# Patient Record
Sex: Male | Born: 1980 | Race: White | Hispanic: No | Marital: Single | Smoking: Current every day smoker
Health system: Southern US, Community
[De-identification: ages and names within clinical notes are randomized; demographics above are authoritative.]

## PROBLEM LIST (undated history)

## (undated) HISTORY — PX: HAND SURGERY: SHX662

## (undated) HISTORY — PX: HERNIA REPAIR: SHX51

---

## 2005-05-31 ENCOUNTER — Emergency Department: Payer: Self-pay | Admitting: Emergency Medicine

## 2005-07-12 ENCOUNTER — Emergency Department: Payer: Self-pay | Admitting: Emergency Medicine

## 2008-10-20 ENCOUNTER — Emergency Department: Payer: Self-pay | Admitting: Unknown Physician Specialty

## 2009-11-18 ENCOUNTER — Emergency Department: Payer: Self-pay | Admitting: Emergency Medicine

## 2015-10-20 ENCOUNTER — Emergency Department
Admission: EM | Admit: 2015-10-20 | Discharge: 2015-10-20 | Disposition: A | Discharge status: Home / Self Care | Payer: Self-pay | Attending: Emergency Medicine | Admitting: Emergency Medicine

## 2015-10-20 ENCOUNTER — Encounter: Payer: Self-pay | Admitting: Emergency Medicine

## 2015-10-20 DIAGNOSIS — F17210 Nicotine dependence, cigarettes, uncomplicated: Secondary | ICD-10-CM | POA: Insufficient documentation

## 2015-10-20 DIAGNOSIS — K047 Periapical abscess without sinus: Secondary | ICD-10-CM | POA: Insufficient documentation

## 2015-10-20 DIAGNOSIS — J0100 Acute maxillary sinusitis, unspecified: Secondary | ICD-10-CM | POA: Insufficient documentation

## 2015-10-20 MED ORDER — PREDNISONE 20 MG PO TABS: ORAL_TABLET | ORAL | Status: DC

## 2015-10-20 MED ORDER — AMOXICILLIN-POT CLAVULANATE 875-125 MG PO TABS: 1.0000 | ORAL_TABLET | Freq: Two times a day (BID) | ORAL | Status: DC

## 2015-10-20 NOTE — ED Notes (Signed)
C/o left side of face hurting intermittently for 1 week, left lower jaw tooth ache onset today.

## 2015-10-20 NOTE — ED Provider Notes (Signed)
[facility] Emergency Department Provider Note  ____________________________________________  Time seen: Approximately 9:46 PM  I have reviewed the triage vital signs and the nursing notes.   HISTORY  Chief Complaint Dental Pain and Facial Pain    HPI Aaron Bates. is a 35 y.o. male , NAD, presents to the emergency department of 1 week history of sinus pressure and congestion with onset of left lower tooth pain today. Had sinus symptoms for about a week and which have not been improving with over-the-counter medications. Noted some left maxillary tenderness over the last few days with onset of left lower tooth pain and swelling over the last 24 hours. Has not had any fevers, chills, body aches.Notes he has gone to [facility] in [facility] [place] for consult to repair his teeth but his dental insurance does not begin until 10/29/2015.   History reviewed. No pertinent past medical history.  There are no active problems to display for this patient.   History reviewed. No pertinent past surgical history.  Current Outpatient Rx  Name  Route  Sig  Dispense  Refill  . amoxicillin-clavulanate (AUGMENTIN) 875-125 MG tablet   Oral   Take 1 tablet by mouth 2 (two) times daily.   20 tablet   0   . predniSONE (DELTASONE) 20 MG tablet      Take 2 tablets by mouth, once daily, for 5 days   10 tablet   0     Allergies Review of patient's allergies indicates no known allergies.  No family history on file.  Social History Social History  Substance Use Topics  . Smoking status: Current Every Day Smoker -- 1.00 packs/day    Types: Cigarettes  . Smokeless tobacco: Never Used  . Alcohol Use: Yes     Comment: weekly     Review of Systems  Constitutional: No fever/chills Eyes:  No discharge ENT: Positive sinus pressure/congestion, nasal congestion, sore throat, left lower tooth pain/gum swelling. Cardiovascular: No chest  pain. Respiratory: Positive cough. No shortness of breath. No wheezing.  Gastrointestinal: No abdominal pain.  No nausea, vomiting.   Musculoskeletal: Negative for back, neck pain.  Skin: Negative for rash. Neurological: Negative for headaches, focal weakness or numbness. 10-point ROS otherwise negative.  ____________________________________________   PHYSICAL EXAM:  VITAL SIGNS: ED Triage Vitals  Enc Vitals Group     BP 10/20/15 2117 157/103 mmHg     Pulse Rate 10/20/15 2117 99     Resp 10/20/15 2117 16     Temp 10/20/15 2117 98.3 F (36.8 C)     Temp Source 10/20/15 2117 Oral     SpO2 10/20/15 2117 98 %     Weight 10/20/15 2117 181 lb (82.101 kg)     Height 10/20/15 2117  (1.803 m)     Head Cir --      Peak Flow --      Pain Score 10/20/15 2118 7     Pain Loc --      Pain Edu? --      Excl. in GC? --     Constitutional: Alert and oriented. Well appearing and in no acute distress. Eyes: Conjunctivae are normal. PERRL. EOMI without pain.  Head: Atraumatic. ENT:  Left maxillary tenderness to palpation.       Ears: TMs visualized within normal limits.      Nose: No congestion/rhinnorhea.      Mouth/Throat: Multiple broken teeth. Left lower gumline with mild erythema and swelling.  Mucous membranes are moist.Pharynx without erythema, swelling or exudate.   Neck: No stridor.Supple with FROM.  Hematological/Lymphatic/Immunilogical: No cervical lymphadenopathy. Cardiovascular: Normal rate, regular rhythm. Normal S1 and S2.   Respiratory: Normal respiratory effort without tachypnea or retractions. Lungs CTAB. Musculoskeletal: Pain to palpation along the left lower jaw line, proximally. No TMJ tenderness or locking.  Neurologic:  Normal speech and language. No gross focal neurologic deficits are appreciated.  Skin:  Left maxillary region with trace swelling. Skin is warm, dry and intact. No rash noted. Psychiatric: Mood and affect are normal. Speech and behavior are  normal. Patient exhibits appropriate insight and judgement.   ____________________________________________   LABS  None ____________________________________________  EKG  None ____________________________________________  RADIOLOGY  None ____________________________________________    PROCEDURES  Procedure(s) performed: None   Medications - No data to display   ____________________________________________   INITIAL IMPRESSION / ASSESSMENT AND PLAN / ED COURSE  Patient's diagnosis is consistent with acute sinusitis and abscessed tooth. Patient will be discharged home with prescriptions for Augmentin to take twice daily for 10 days as well as prednisone to take as prescribed to decrease inflammation and pain. Patient is to follow up with [facility] in March to continue dental care. Patient is given ED precautions to return to the ED for any worsening or new symptoms.   ____________________________________________  FINAL CLINICAL IMPRESSION(S) / ED DIAGNOSES  Final diagnoses:  Acute maxillary sinusitis, recurrence not specified  Tooth abscess      NEW MEDICATIONS STARTED DURING THIS VISIT:  New Prescriptions   AMOXICILLIN-CLAVULANATE (AUGMENTIN) 875-125 MG TABLET    Take 1 tablet by mouth 2 (two) times daily.   PREDNISONE (DELTASONE) 20 MG TABLET    Take 2 tablets by mouth, once daily, for 5 days         Hope Pigeon, PA-C 10/20/15 2155  Sharyn Creamer, MD 10/21/15 0000

## 2015-10-20 NOTE — ED Notes (Signed)
AAOx3.  Skin warm and dry.  NAD 

## 2015-10-20 NOTE — Discharge Instructions (Signed)
Dental Abscess A dental abscess is pus in or around a tooth. HOME CARE  Take medicines only as told by your dentist.  If you were prescribed antibiotic medicine, finish all of it even if you start to feel better.  Rinse your mouth (gargle) often with salt water.  Do not drive or use heavy machinery, like a lawn mower, while taking pain medicine.  Do not apply heat to the outside of your mouth.  Keep all follow-up visits as told by your dentist. This is important. GET HELP IF:  Your pain is worse, and medicine does not help. GET HELP RIGHT AWAY IF:  You have a fever or chills.  Your symptoms suddenly get worse.  You have a very bad headache.  You have problems breathing or swallowing.  You have trouble opening your mouth.  You have puffiness (swelling) in your neck or around your eye.   This information is not intended to replace advice given to you by your health care provider. Make sure you discuss any questions you have with your health care provider.   Document Released: 12/31/2014 Document Reviewed: 12/31/2014 Elsevier Interactive Patient Education 2016 [employer].  Sinusitis, Adult Sinusitis is redness, soreness, and puffiness (inflammation) of the air pockets in the bones of your face (sinuses). The redness, soreness, and puffiness can cause air and mucus to get trapped in your sinuses. This can allow germs to grow and cause an infection.  HOME CARE   Drink enough fluids to keep your pee (urine) clear or pale yellow.  Use a humidifier in your home.  Run a hot shower to create steam in the bathroom. Sit in the bathroom with the door closed. Breathe in the steam 3-4 times a day.  Put a warm, moist washcloth on your face 3-4 times a day, or as told by your doctor.  Use salt water sprays (saline sprays) to wet the thick fluid in your nose. This can help the sinuses drain.  Only take medicine as told by your doctor. GET HELP RIGHT AWAY IF:   Your pain gets  worse.  You have very bad headaches.  You are sick to your stomach (nauseous).  You throw up (vomit).  You are very sleepy (drowsy) all the time.  Your face is puffy (swollen).  Your vision changes.  You have a stiff neck.  You have trouble breathing. MAKE SURE YOU:   Understand these instructions.  Will watch your condition.  Will get help right away if you are not doing well or get worse.   This information is not intended to replace advice given to you by your health care provider. Make sure you discuss any questions you have with your health care provider.   Document Released: 02/02/2008 Document Revised: 09/06/2014 Document Reviewed: 03/21/2012 Elsevier Interactive Patient Education [employer]

## 2016-06-26 ENCOUNTER — Emergency Department: Payer: BLUE CROSS/BLUE SHIELD

## 2016-06-26 ENCOUNTER — Emergency Department
Admission: EM | Admit: 2016-06-26 | Discharge: 2016-06-26 | Disposition: A | Discharge status: Home / Self Care | Payer: BLUE CROSS/BLUE SHIELD | Attending: Emergency Medicine | Admitting: Emergency Medicine

## 2016-06-26 DIAGNOSIS — W2201XA Walked into wall, initial encounter: Secondary | ICD-10-CM | POA: Insufficient documentation

## 2016-06-26 DIAGNOSIS — S6991XA Unspecified injury of right wrist, hand and finger(s), initial encounter: Secondary | ICD-10-CM | POA: Diagnosis present

## 2016-06-26 DIAGNOSIS — S62304A Unspecified fracture of fourth metacarpal bone, right hand, initial encounter for closed fracture: Secondary | ICD-10-CM | POA: Insufficient documentation

## 2016-06-26 DIAGNOSIS — F17210 Nicotine dependence, cigarettes, uncomplicated: Secondary | ICD-10-CM | POA: Insufficient documentation

## 2016-06-26 DIAGNOSIS — Y929 Unspecified place or not applicable: Secondary | ICD-10-CM | POA: Diagnosis not present

## 2016-06-26 DIAGNOSIS — Z7952 Long term (current) use of systemic steroids: Secondary | ICD-10-CM | POA: Insufficient documentation

## 2016-06-26 DIAGNOSIS — Z792 Long term (current) use of antibiotics: Secondary | ICD-10-CM | POA: Diagnosis not present

## 2016-06-26 DIAGNOSIS — Y9389 Activity, other specified: Secondary | ICD-10-CM | POA: Diagnosis not present

## 2016-06-26 DIAGNOSIS — Y999 Unspecified external cause status: Secondary | ICD-10-CM | POA: Insufficient documentation

## 2016-06-26 DIAGNOSIS — S62302A Unspecified fracture of third metacarpal bone, right hand, initial encounter for closed fracture: Secondary | ICD-10-CM | POA: Insufficient documentation

## 2016-06-26 DIAGNOSIS — S6291XA Unspecified fracture of right wrist and hand, initial encounter for closed fracture: Secondary | ICD-10-CM

## 2016-06-26 MED ORDER — NAPROXEN 500 MG PO TABS
500.0000 mg | ORAL_TABLET | Freq: Once | ORAL | Status: AC
  Administered 2016-06-26: 500 mg via ORAL
  Filled 2016-06-26: qty 1

## 2016-06-26 MED ORDER — TRAMADOL HCL 50 MG PO TABS: 50.0000 mg | ORAL_TABLET | Freq: Four times a day (QID) | ORAL | 0 refills | Status: AC | PRN

## 2016-06-26 MED ORDER — NAPROXEN 500 MG PO TABS: 500.0000 mg | ORAL_TABLET | Freq: Two times a day (BID) | ORAL | 0 refills | Status: DC

## 2016-06-26 NOTE — ED Notes (Signed)
See triage note  Punched wall with right hand   Pain and swelling noted

## 2016-06-26 NOTE — ED Triage Notes (Signed)
Pt states he had a crazy night last night and punched a wall. Pain and swelling in right hand.

## 2016-06-26 NOTE — ED Provider Notes (Signed)
[facility] Emergency Department Provider Note   ____________________________________________   First MD Initiated Contact with Patient 06/26/16 [phone]     (approximate)  I have reviewed the triage vital signs and the nursing notes.   HISTORY  Chief Complaint Hand Injury    HPI Aaron BignessJerry W Deharo Jr. is a 35 y.o. male patient complain of pain and edema to right hand secondary to punching a wall last night.Patient denies loss of sensation. Patient state pain and mild discomfort increased with flexion of the fingers. Patient is right-hand dominant. No palliative measures taken for this complaint.   No past medical history on file.  There are no active problems to display for this patient.   No past surgical history on file.  Prior to Admission medications   Medication Sig Start Date End Date Taking? Authorizing Provider  amoxicillin-clavulanate (AUGMENTIN) 875-125 MG tablet Take 1 tablet by mouth 2 (two) times daily. 10/20/15   Jami L Hagler, PA-C  naproxen (NAPROSYN) 500 MG tablet Take 1 tablet (500 mg total) by mouth 2 (two) times daily with a meal. 06/26/16   Joni Reiningonald K Virgle Arth, PA-C  predniSONE (DELTASONE) 20 MG tablet Take 2 tablets by mouth, once daily, for 5 days 10/20/15   Jami L Hagler, PA-C  traMADol (ULTRAM) 50 MG tablet Take 1 tablet (50 mg total) by mouth every 6 (six) hours as needed. 06/26/16 06/26/17  Joni Reiningonald K Cyris Maalouf, PA-C    Allergies Review of patient's allergies indicates no known allergies.  No family history on file.  Social History Social History  Substance Use Topics  . Smoking status: Current Every Day Smoker    Packs/day: 1.00    Types: Cigarettes  . Smokeless tobacco: Never Used  . Alcohol use Yes     Comment: weekly    Review of Systems Constitutional: No fever/chills Eyes: No visual changes. ENT: No sore throat. Cardiovascular: Denies chest pain. Respiratory: Denies shortness of breath. Gastrointestinal: No abdominal  pain.  No nausea, no vomiting.  No diarrhea.  No constipation. Genitourinary: Negative for dysuria. Musculoskeletal: Negative for back pain. Skin: Negative for rash. Neurological: Negative for headaches, focal weakness or numbness.    ____________________________________________   PHYSICAL EXAM:  VITAL SIGNS: ED Triage Vitals [06/26/16 0905]  Enc Vitals Group     BP (!) 153/99     Pulse Rate 99     Resp 16     Temp 97.8 F (36.6 C)     Temp Source Oral     SpO2 98 %     Weight 180 lb (81.6 kg)     Height 5\' 11"  (1.803 m)     Head Circumference      Peak Flow      Pain Score 5     Pain Loc      Pain Edu?      Excl. in GC?     Constitutional: Alert and oriented. Well appearing and in no acute distress. Eyes: Conjunctivae are normal. PERRL. EOMI. Head: Atraumatic. Nose: No congestion/rhinnorhea. Mouth/Throat: Mucous membranes are moist.  Oropharynx non-erythematous. Neck: No stridor.  No cervical spine tenderness to palpation. Hematological/Lymphatic/Immunilogical: No cervical lymphadenopathy. Cardiovascular: Normal rate, regular rhythm. Grossly normal heart sounds.  Good peripheral circulation. Respiratory: Normal respiratory effort.  No retractions. Lungs CTAB. Gastrointestinal: Soft and nontender. No distention. No abdominal bruits. No CVA tenderness. Musculoskeletal: No obvious deformity to the right hand. Patient has some moderate edema dose aspect of the right hand.  Neurologic:  Normal speech  and language. No gross focal neurologic deficits are appreciated. No gait instability. Skin:  Skin is warm, dry and intact. No rash noted. No abrasions. Psychiatric: Mood and affect are normal. Speech and behavior are normal.  ____________________________________________   LABS (all labs ordered are listed, but only abnormal results are displayed)  Labs Reviewed - No data to  display ____________________________________________  EKG   ____________________________________________  RADIOLOGY  X-rays reveal fracture of the third and fourth metacarpal. ____________________________________________   PROCEDURES  Procedure(s) performed: None  Procedures  Critical Care performed: No  ____________________________________________   INITIAL IMPRESSION / ASSESSMENT AND PLAN / ED COURSE  Pertinent labs & imaging results that were available during my care of the patient were reviewed by me and considered in my medical decision making (see chart for details).  Fracture right hand. Patient given discharge care instructions. Patient advised follow orthopedics by calling for appointment in 2 days. Patient given a prescription for tramadol and naproxen.  Clinical Course   Volar hand splint applied.  ____________________________________________   FINAL CLINICAL IMPRESSION(S) / ED DIAGNOSES  Final diagnoses:  Hand fracture, right, closed, initial encounter      NEW MEDICATIONS STARTED DURING THIS VISIT:  New Prescriptions   NAPROXEN (NAPROSYN) 500 MG TABLET    Take 1 tablet (500 mg total) by mouth 2 (two) times daily with a meal.   TRAMADOL (ULTRAM) 50 MG TABLET    Take 1 tablet (50 mg total) by mouth every 6 (six) hours as needed.     Note:  This document was prepared using Dragon voice recognition software and may include unintentional dictation errors.    Joni ReiningRonald K Kaesen Rodriguez, PA-C 06/26/16 1003    Nita Sicklearolina Veronese, MD 06/26/16 [phone]

## 2018-06-03 IMAGING — DX DG HAND COMPLETE 3+V*R*
3 series · 3 of 3 positions shown · non-contrast
Comparison: None.

CLINICAL DATA: Right hand pain and swelling after striking a wall
last night.

EXAM:
RIGHT HAND - COMPLETE 3+ VIEW

[hand ap]
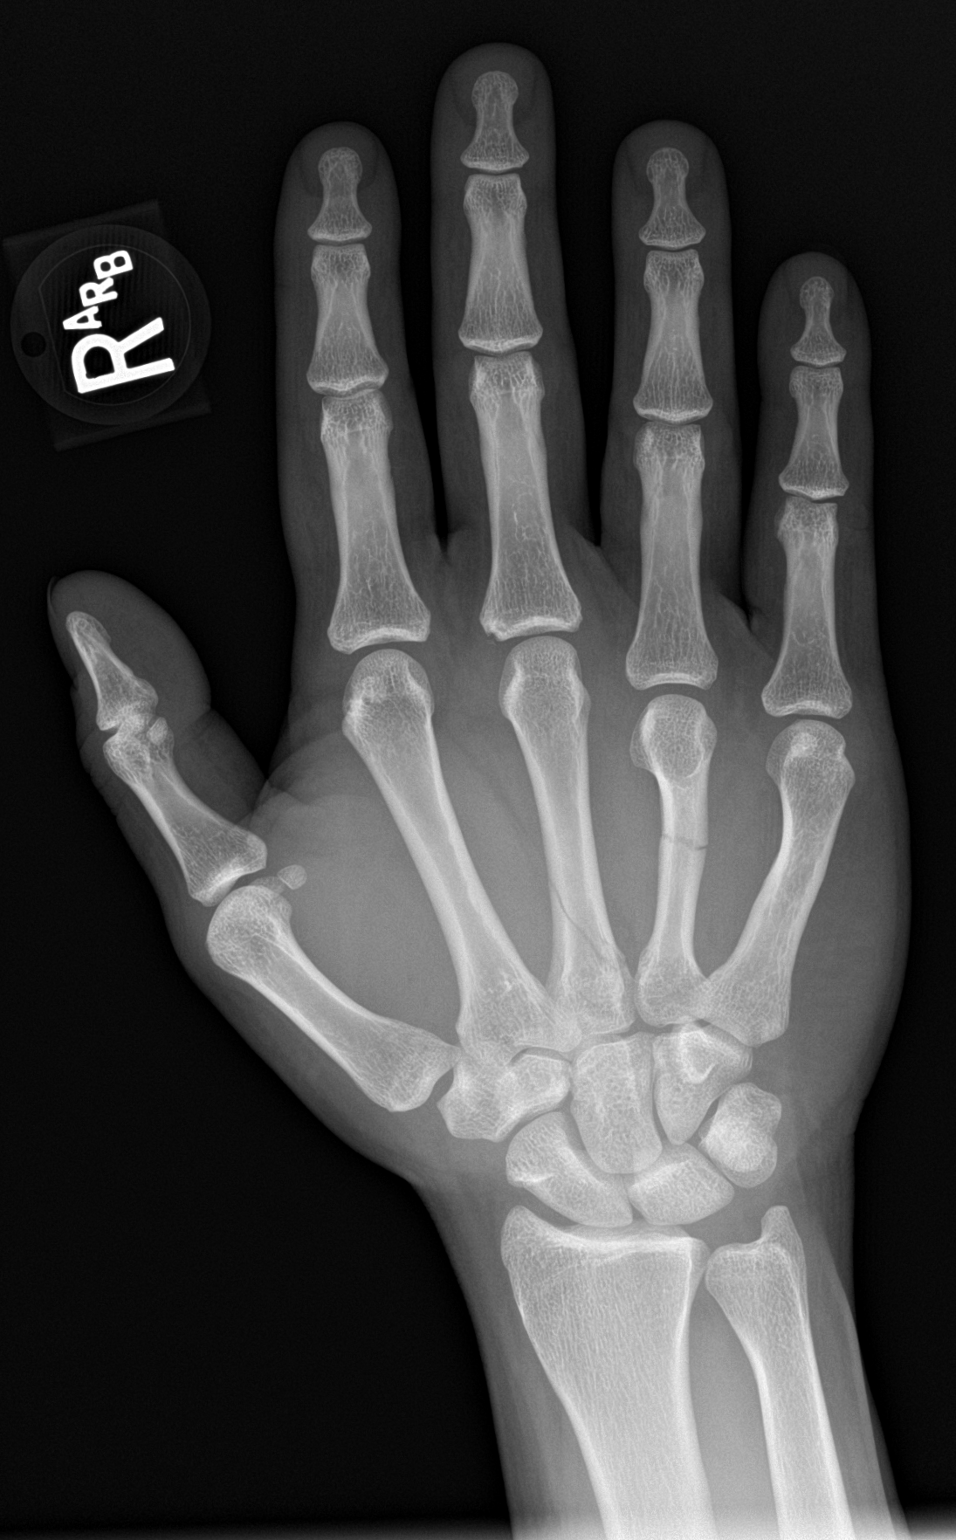

[hand obl]
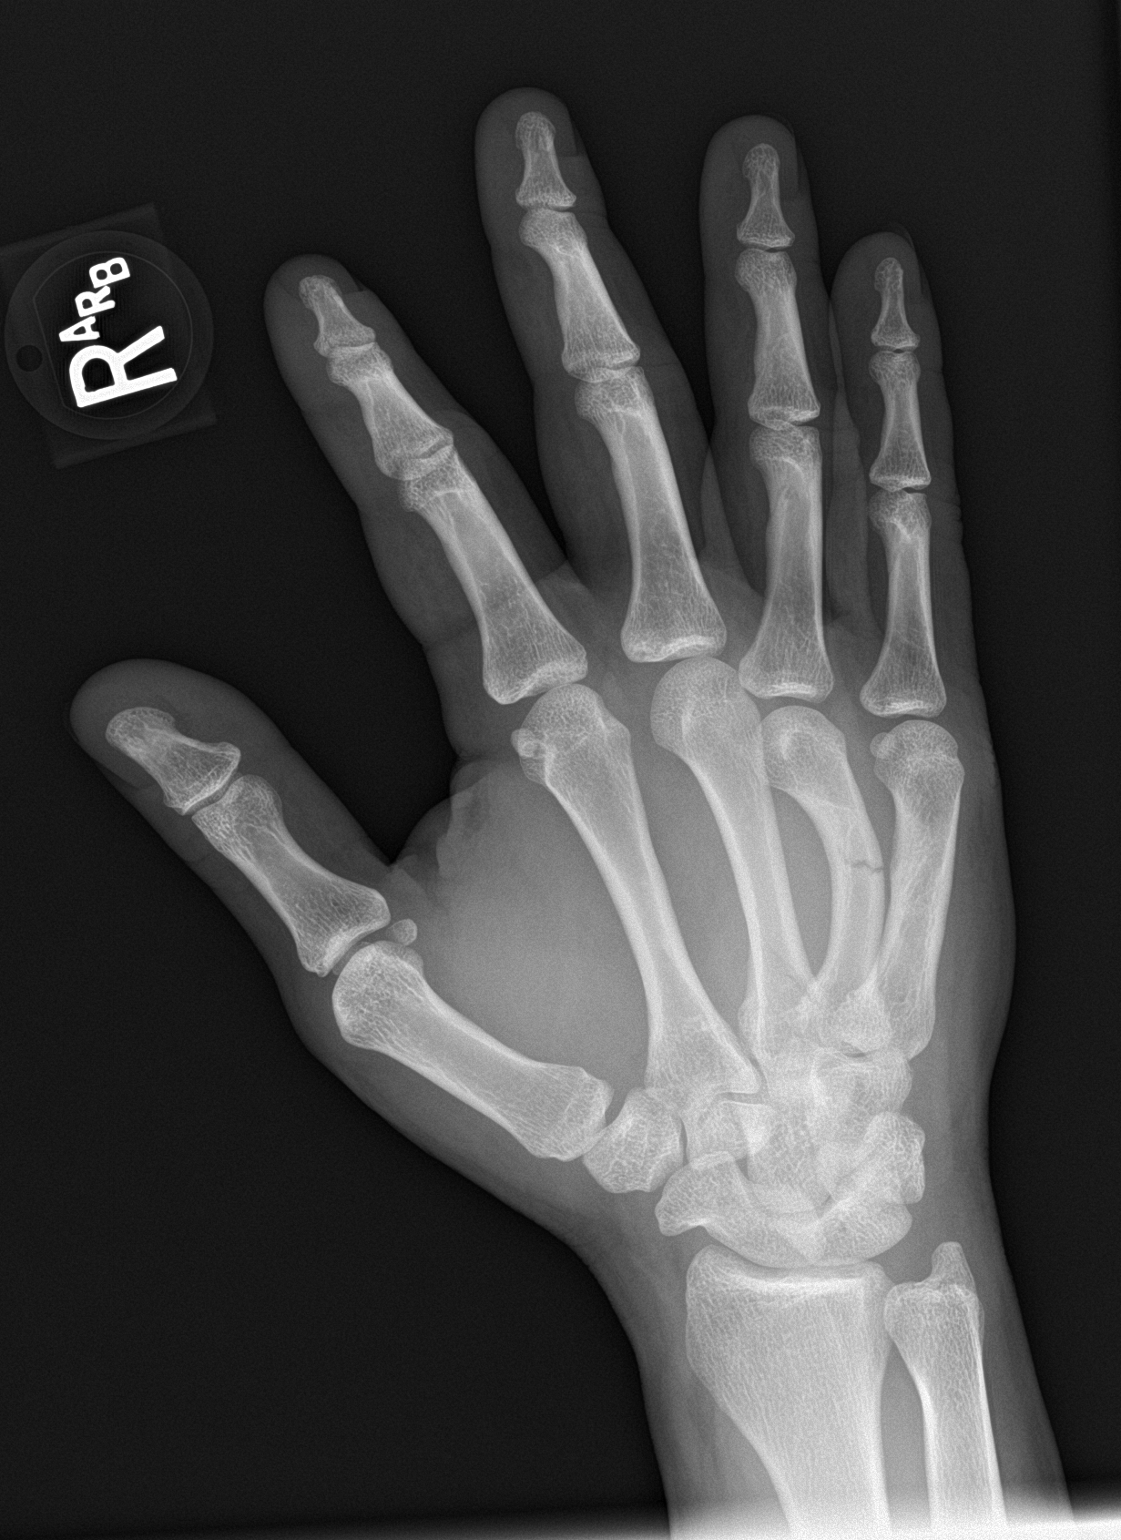

[hand lat]
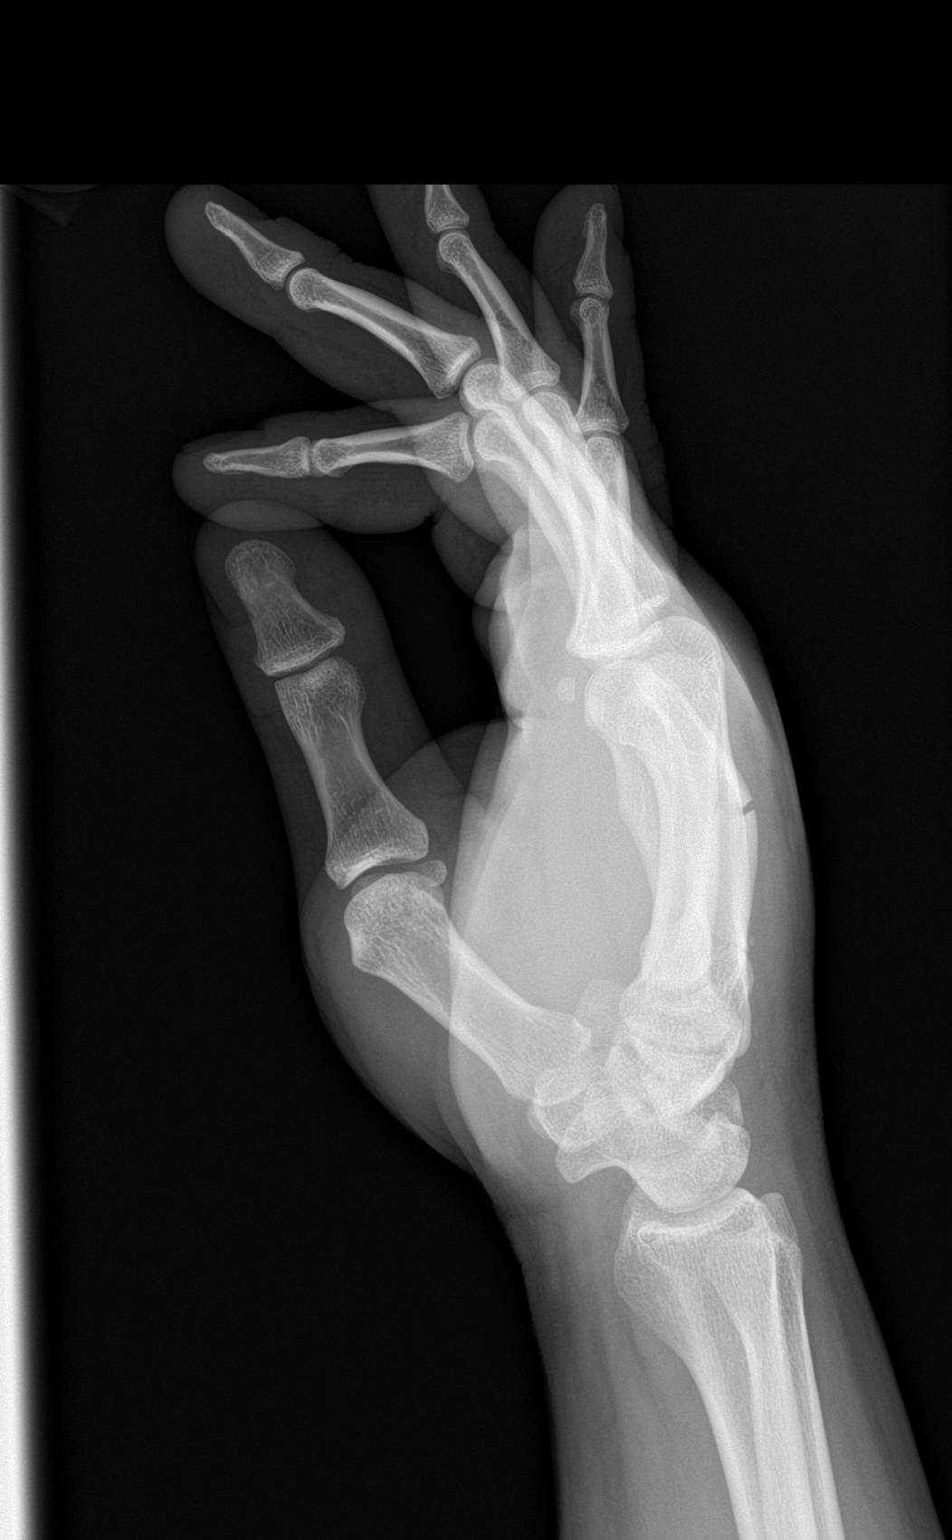

[3 of 3 positions shown; findings below may reference images not displayed]

FINDINGS: There is a transverse fracture of the midshaft of the fourth
metacarpal with minimal angulation. There is a spiral fracture of
the proximal shaft of the third metacarpal. Old deformity of the
fifth metacarpal.
IMPRESSION: Acute fractures of the third and fourth metacarpals.

## 2020-08-07 ENCOUNTER — Ambulatory Visit
Admission: EM | Admit: 2020-08-07 | Discharge: 2020-08-07 | Disposition: A | Discharge status: Home / Self Care | Payer: BLUE CROSS/BLUE SHIELD | Attending: Family Medicine | Admitting: Family Medicine

## 2020-08-07 ENCOUNTER — Other Ambulatory Visit: Payer: Self-pay

## 2020-08-07 DIAGNOSIS — H66003 Acute suppurative otitis media without spontaneous rupture of ear drum, bilateral: Secondary | ICD-10-CM | POA: Diagnosis not present

## 2020-08-07 MED ORDER — KETOROLAC TROMETHAMINE 10 MG PO TABS: 10.0000 mg | ORAL_TABLET | Freq: Four times a day (QID) | ORAL | 0 refills | Status: AC | PRN

## 2020-08-07 MED ORDER — AMOXICILLIN-POT CLAVULANATE 875-125 MG PO TABS: 1.0000 | ORAL_TABLET | Freq: Two times a day (BID) | ORAL | 0 refills | Status: AC

## 2020-08-07 NOTE — ED Triage Notes (Signed)
Patient states that he is here for sinus pain and pressure, ear pressure x 8 days. States that ear pressure just started within the last day.

## 2020-08-07 NOTE — ED Provider Notes (Signed)
[facility]    CSN: [id number] Arrival date & time: 08/07/20  0809  History   Chief Complaint Chief Complaint  Patient presents with  . Facial Pain   HPI  39 year old male presents for evaluation of sinus pain and pressure as well as ear pain.  He reports sinus pain and pressure started approximately 8 days ago.  This has improved.  However, over the past few days he has been experiencing bilateral ear pain.  His daughter has been sick as well.  No fever.  No relieving factors.  He has some associated difficulty hearing.  Pain 5/10 in severity.  Described as aching.  No other complaints.  Home Medications    Prior to Admission medications   Medication Sig Start Date End Date Taking? Authorizing Provider  amoxicillin-clavulanate (AUGMENTIN) 875-125 MG tablet Take 1 tablet by mouth 2 (two) times daily. 08/07/20   Tommie Sams, DO  ketorolac (TORADOL) 10 MG tablet Take 1 tablet (10 mg total) by mouth every 6 (six) hours as needed for moderate pain or severe pain. 08/07/20   Tommie Sams, DO    Family History Family History  Problem Relation Age of Onset  . Healthy Mother   . Cancer Father     Social History Social History   Tobacco Use  . Smoking status: Current Every Day Smoker    Packs/day: 1.00    Types: Cigarettes  . Smokeless tobacco: Never Used  Vaping Use  . Vaping Use: Never used  Substance Use Topics  . Alcohol use: Yes    Comment: weekly  . Drug use: Yes    Types: Marijuana     Allergies   Patient has no known allergies.   Review of Systems Review of Systems  Constitutional: Negative for fever.  HENT: Positive for congestion, ear pain, sinus pressure and sinus pain.    Physical Exam Triage Vital Signs ED Triage Vitals  Enc Vitals Group     BP 08/07/20 0830 (!) 142/108     Pulse Rate 08/07/20 0830 92     Resp 08/07/20 0830 18     Temp 08/07/20 0830 98.3 F (36.8 C)     Temp Source 08/07/20 0830 Oral     SpO2 08/07/20 0830 98 %      Weight 08/07/20 0827 200 lb (90.7 kg)     Height 08/07/20 0827 5\' 11"  (1.803 m)     Head Circumference --      Peak Flow --      Pain Score 08/07/20 0827 5     Pain Loc --      Pain Edu? --      Excl. in GC? --    Updated Vital Signs BP (!) 142/108 (BP Location: Right Arm)   Pulse 92   Temp 98.3 F (36.8 C) (Oral)   Resp 18   Ht 5\' 11"  (1.803 m)   Wt 90.7 kg   SpO2 98%   BMI 27.89 kg/m   Visual Acuity Right Eye Distance:   Left Eye Distance:   Bilateral Distance:    Right Eye Near:   Left Eye Near:    Bilateral Near:     Physical Exam Vitals and nursing note reviewed.  Constitutional:      General: He is not in acute distress.    Appearance: Normal appearance. He is not ill-appearing.  HENT:     Head: Normocephalic and atraumatic.     Ears:     Comments: Right and  left TMs with bulging and erythema bilaterally. Pulmonary:     Effort: Pulmonary effort is normal. No respiratory distress.  Neurological:     Mental Status: He is alert.  Psychiatric:        Mood and Affect: Mood normal.        Behavior: Behavior normal.    UC Treatments / Results  Labs (all labs ordered are listed, but only abnormal results are displayed) Labs Reviewed - No data to display  EKG   Radiology No results found.  Procedures Procedures (including critical care time)  Medications Ordered in UC Medications - No data to display  Initial Impression / Assessment and Plan / UC Course  I have reviewed the triage vital signs and the nursing notes.  Pertinent labs & imaging results that were available during my care of the patient were reviewed by me and considered in my medical decision making (see chart for details).    39 year old male presents with bilateral otitis media.  Treating with Augmentin.  Toradol as needed for pain.  Final Clinical Impressions(s) / UC Diagnoses   Final diagnoses:  Acute suppurative otitis media of both ears without spontaneous rupture of  tympanic membranes, recurrence not specified   Discharge Instructions   None    ED Prescriptions    Medication Sig Dispense Auth. Provider   ketorolac (TORADOL) 10 MG tablet Take 1 tablet (10 mg total) by mouth every 6 (six) hours as needed for moderate pain or severe pain. 20 tablet Careena Degraffenreid G, DO   amoxicillin-clavulanate (AUGMENTIN) 875-125 MG tablet Take 1 tablet by mouth 2 (two) times daily. 20 tablet Tommie Sams, DO     PDMP not reviewed this encounter.   Everlene Other [place], [place] 08/07/20 [phone]
